# Patient Record
Sex: Female | Born: 1935 | Race: White | Hispanic: No | Smoking: Never smoker
Health system: Southern US, Community
[De-identification: ages and names within clinical notes are randomized; demographics above are authoritative.]

---

## 1997-12-26 ENCOUNTER — Ambulatory Visit (HOSPITAL_COMMUNITY): Admission: RE | Admit: 1997-12-26 | Discharge: 1997-12-26 | Payer: Self-pay | Admitting: Urology

## 1998-01-05 ENCOUNTER — Ambulatory Visit (HOSPITAL_COMMUNITY): Admission: RE | Admit: 1998-01-05 | Discharge: 1998-01-05 | Payer: Self-pay | Admitting: Urology

## 2009-12-11 ENCOUNTER — Ambulatory Visit (HOSPITAL_COMMUNITY): Admission: RE | Admit: 2009-12-11 | Discharge: 2009-12-11 | Payer: Self-pay | Admitting: Urology

## 2010-01-04 ENCOUNTER — Ambulatory Visit (HOSPITAL_COMMUNITY): Admission: RE | Admit: 2010-01-04 | Discharge: 2010-01-04 | Payer: Self-pay | Admitting: Urology

## 2011-08-17 IMAGING — CR DG ABDOMEN 1V
1 series · 1 of 1 positions shown · non-contrast
Comparison: KUB 12/11/2009.

CLINICAL DATA: Distal left ureteral stone.

ABDOMEN - 1 VIEW

[t abdomen supine]
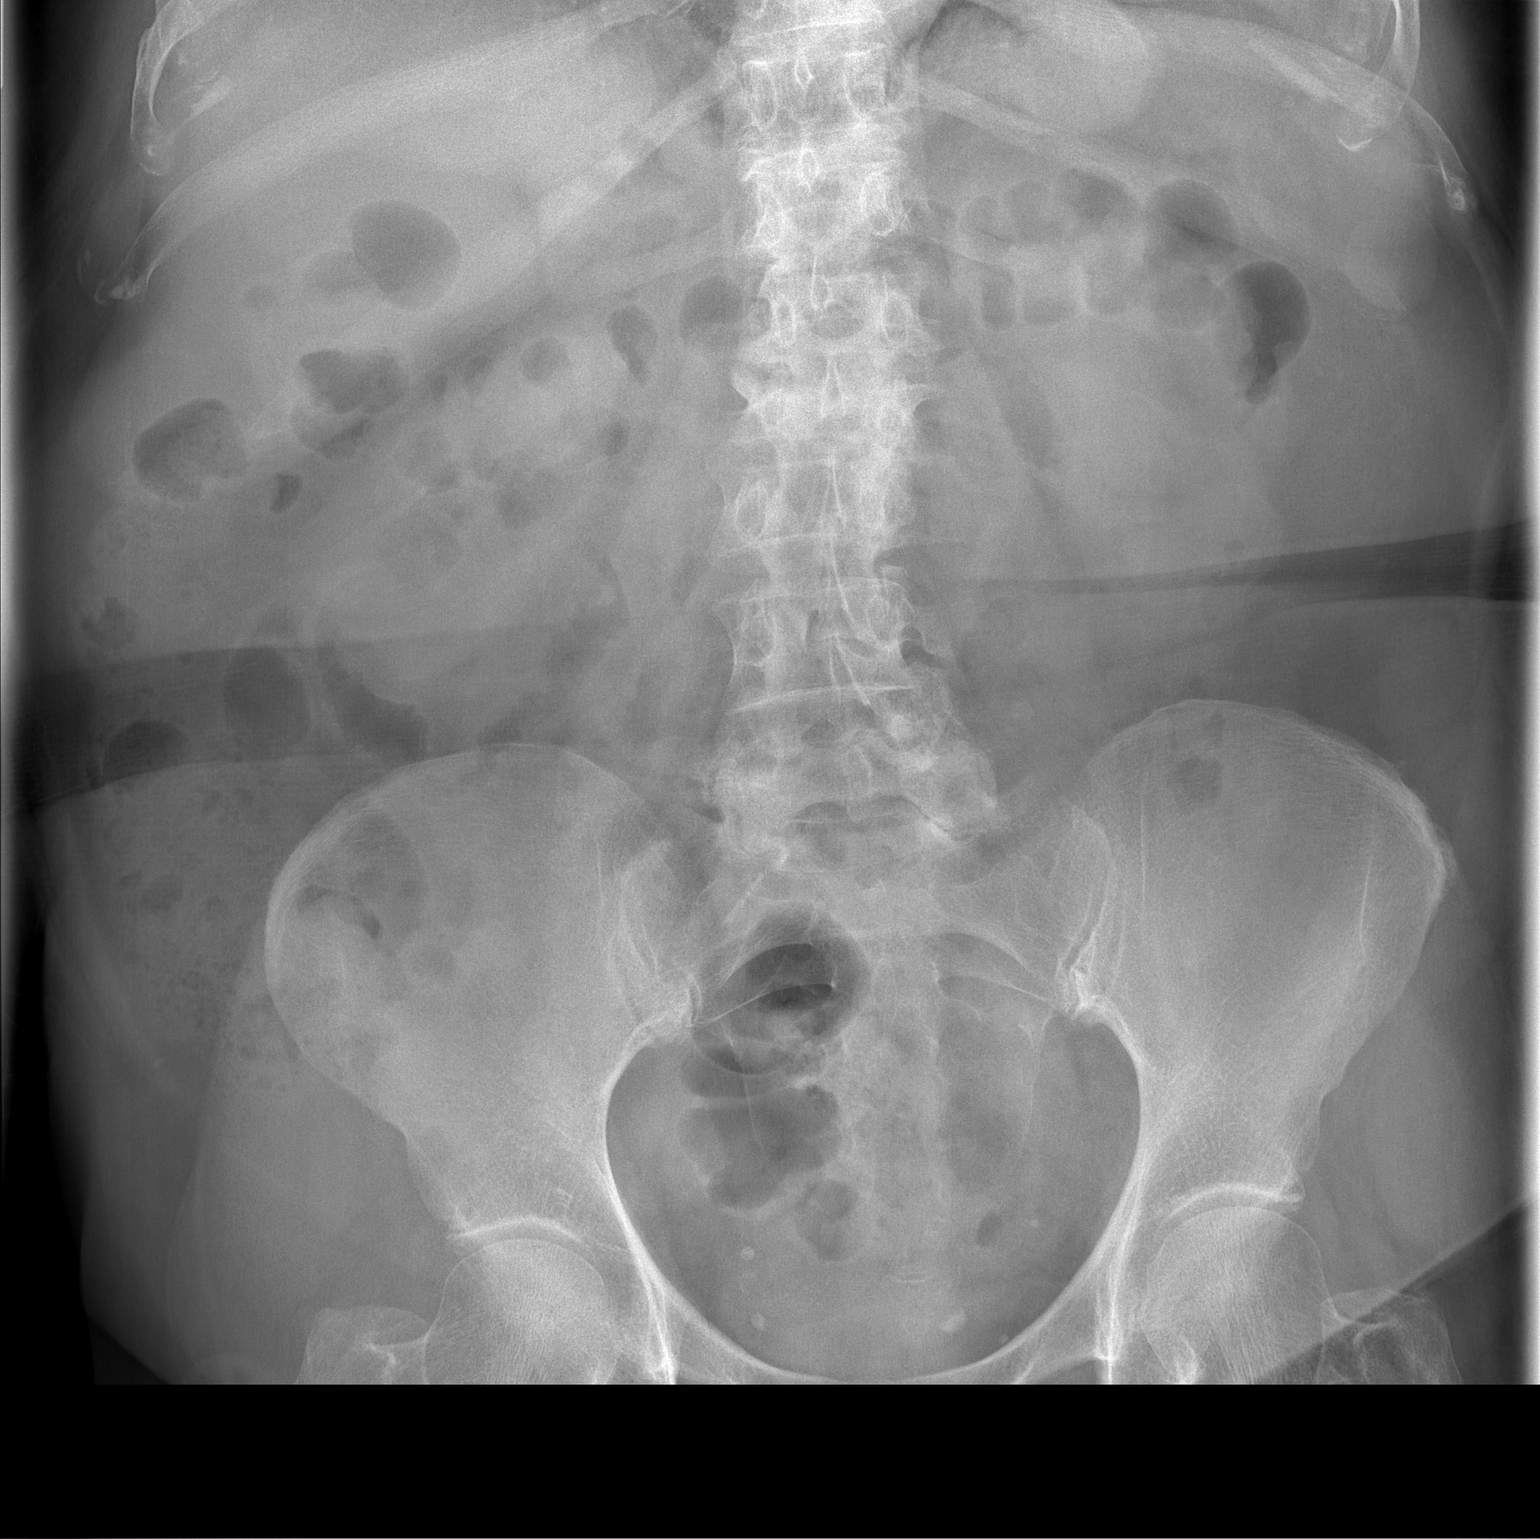

[1 of 1 positions shown; findings below may reference images not displayed]

FINDINGS: No definite renal or ureteral stones are seen on this
examination.  Calcifications in the pelvis are unchanged and likely
represent phleboliths although they cannot be definitively
characterized.  Bowel gas pattern is unremarkable.  Compression
fracture deformities of L1 and L2 are unchanged.
IMPRESSION: 1.  No definite renal or ureteral stones are seen by plain film.
2.  Compression fracture deformities L1 and L2.

## 2013-10-12 ENCOUNTER — Encounter: Payer: Self-pay | Admitting: Gastroenterology

## 2013-11-23 ENCOUNTER — Ambulatory Visit: Payer: Self-pay | Admitting: Gastroenterology

## 2022-04-22 ENCOUNTER — Encounter (INDEPENDENT_AMBULATORY_CARE_PROVIDER_SITE_OTHER): Payer: Self-pay

## 2022-04-25 ENCOUNTER — Ambulatory Visit (INDEPENDENT_AMBULATORY_CARE_PROVIDER_SITE_OTHER): Payer: Medicare Other | Admitting: Ophthalmology

## 2022-04-25 ENCOUNTER — Encounter (INDEPENDENT_AMBULATORY_CARE_PROVIDER_SITE_OTHER): Payer: Self-pay | Admitting: Ophthalmology

## 2022-04-25 DIAGNOSIS — H353132 Nonexudative age-related macular degeneration, bilateral, intermediate dry stage: Secondary | ICD-10-CM | POA: Diagnosis not present

## 2022-04-25 DIAGNOSIS — H35371 Puckering of macula, right eye: Secondary | ICD-10-CM | POA: Diagnosis not present

## 2022-04-25 DIAGNOSIS — Z961 Presence of intraocular lens: Secondary | ICD-10-CM | POA: Diagnosis not present

## 2022-04-25 MED ORDER — OFLOXACIN 0.3 % OP SOLN: 1.0000 [drp] | Freq: Four times a day (QID) | OPHTHALMIC | 0 refills | Status: AC

## 2022-04-25 MED ORDER — PREDNISOLONE ACETATE 1 % OP SUSP: 1.0000 [drp] | Freq: Four times a day (QID) | OPHTHALMIC | 0 refills | Status: AC

## 2022-04-25 NOTE — Progress Notes (Signed)
04/25/2022     CHIEF COMPLAINT Patient presents for  Chief Complaint  Patient presents with   Retina Evaluation      HISTORY OF PRESENT ILLNESS: Brooke Delgado is a 87 y.o. female who presents to the clinic today for:   HPI     Retina Evaluation           Laterality: right eye         Comments   NP- Epiretinal membrane od - ref by mccuen Pt states her vision has been stable Pt denies any new floaters or FOL      Last edited by Edmon Crape, MD on 04/25/2022 11:00 AM.      Referring physician: Maris Berger, MD [address] [place],  [place] [address]  HISTORICAL INFORMATION:   Selected notes from the MEDICAL RECORD NUMBER       CURRENT MEDICATIONS: Current Outpatient Medications (Ophthalmic Drugs)  Medication Sig   ofloxacin (OCUFLOX) 0.3 % ophthalmic solution Place 1 drop into the right eye 4 (four) times daily for 25 days.   prednisoLONE acetate (PRED FORTE) 1 % ophthalmic suspension Place 1 drop into the right eye 4 (four) times daily.   No current facility-administered medications for this visit. (Ophthalmic Drugs)   No current outpatient medications on file. (Other)   No current facility-administered medications for this visit. (Other)      REVIEW OF SYSTEMS: ROS   Negative for: Constitutional, Gastrointestinal, Neurological, Skin, Genitourinary, Musculoskeletal, HENT, Endocrine, Cardiovascular, Eyes, Respiratory, Psychiatric, Allergic/Imm, Heme/Lymph Last edited by Aleene Davidson, CMA on 04/25/2022  9:40 AM.       ALLERGIES Not on File  PAST MEDICAL HISTORY History reviewed. No pertinent past medical history. History reviewed. No pertinent surgical history.  FAMILY HISTORY History reviewed. No pertinent family history.  SOCIAL HISTORY Social History   Tobacco Use   Smoking status: Never   Smokeless tobacco: Never         OPHTHALMIC EXAM:  Base Eye Exam     Visual Acuity (Snellen - Linear)       Right Left    Dist [place] 20/80 20/25 +2   Dist ph [place] NI          Tonometry (Tonopen, 9:48 AM)       Right Left   Pressure 6 10         Pupils       Pupils   Right PERRL   Left PERRL         Visual Fields       Left Right    Full Full         Extraocular Movement       Right Left    Ortho Ortho    -- -- --  --  --  -- -- --   -- -- --  --  --  -- -- --           Dilation     Right eye: 2.5% Phenylephrine, 1.0% Mydriacyl @ 9:41 AM           Slit Lamp and Fundus Exam     External Exam       Right Left   External Normal Normal         Slit Lamp Exam       Right Left   Lids/Lashes Normal Normal   Conjunctiva/Sclera White and quiet White and quiet   Cornea Clear Clear   Anterior Chamber Deep  and quiet Deep and quiet   Iris Round and reactive Round and reactive   Lens Centered posterior chamber intraocular lens Centered posterior chamber intraocular lens   Anterior Vitreous Normal Normal         Fundus Exam       Right Left   Posterior Vitreous Normal Normal   Disc Normal Normal   C/D Ratio 0.4 0.4   Macula Epiretinal membrane, moderate topo distortion, Soft drusen, Retinal pigment epithelial detachment, 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 Scan locations included subfoveal. Central Foveal Thickness: 525. Progression has no prior data. Findings include abnormal foveal contour, retinal drusen , cystoid macular edema, epiretinal membrane.   Left Eye Quality was good. Scan locations included subfoveal. Central Foveal Thickness: 274. Progression has no prior data. Findings include normal foveal contour, retinal drusen .   Notes OD with significant thickening macular topographic distortion as well as  foveal macular schisis secondary to epiretinal membrane.  OU no sign of CNVM     Color Fundus Photography Optos - OU - Both Eyes       Right Eye Progression has no prior data. Disc findings include normal observations. Macula : drusen, epiretinal membrane.   Left Eye Progression has no prior data. Disc findings include normal observations. Macula : drusen. Vessels : normal observations. Periphery : normal observations.              ASSESSMENT/PLAN:  Epiretinal membrane, right eye OD with significant epiretinal membrane macular topographic distortion foveal macular schisis accounts for acuity in the right eye.  There is no sign of wet AMD.  We will need and recommend vitrectomy membrane peel the right eye.  Clinical course of recovery discussed with the patient including the healing process taking days weeks and sometimes even several months  Intermediate stage nonexudative age-related macular degeneration of both eyes The nature of age--related macular degeneration was discussed with the patient as well as the distinction between dry and wet types. Checking an Amsler Grid daily with advice to return immediately should a distortion develop, was given to the patient. The patient 's smoking status now and in the past was determined and advice based on the AREDS study was provided regarding the consumption of antioxidant supplements. AREDS 2 vitamin formulation was recommended. Consumption of dark leafy vegetables and fresh fruits of various colors was recommended. Treatment modalities for wet macular degeneration particularly the use of intravitreal injections of anti-blood vessel growth factors was discussed with the patient. Avastin, Lucentis, and Eylea are the available options. On occasion, therapy includes the use of photodynamic therapy and thermal laser. Stressed to the patient do not rub eyes.  Patient was advised to check Amsler Grid daily and return immediately if changes are  noted. Instructions on using the grid were given to the patient. All patient questions were answered.  Pseudophakia of both eyes Looks great OU per Dr. Ellie Lunch     ICD-10-CM   1. Epiretinal membrane, right eye  H35.371 OCT, Retina - OU - Both Eyes    Color Fundus Photography Optos - OU - Both Eyes    2. Intermediate stage nonexudative age-related macular degeneration of both eyes  H35.3132  Color Fundus Photography Optos - OU - Both Eyes    3. Pseudophakia of both eyes  Z96.1       1.  OD, with severe foveal macular schisis and macular thickening secondary to ERM.  I have recommended consideration for vitrectomy membrane peel.  I explained the patient that our next opportunity for surgery is the last Wednesday in September.  That as of October 5 that I will no longer be a affiliated with [facility] and the attendant contracts for [facility] would no longer be in place at that time.  2.  Reassured the patient that with the contrast in the October 5 that her continued postoperative care will continued unabated.  Without charge during the diagnosis of the same type  3.  Benefits reviewed with the patient.  She understands the need for surgical intervention.  She also understands she can wait 2 or 3 months for the new contracting to occur  Ophthalmic Meds Ordered this visit:  Meds ordered this encounter  Medications   ofloxacin (OCUFLOX) 0.3 % ophthalmic solution    Sig: Place 1 drop into the right eye 4 (four) times daily for 25 days.    Dispense:  5 mL    Refill:  0   prednisoLONE acetate (PRED FORTE) 1 % ophthalmic suspension    Sig: Place 1 drop into the right eye 4 (four) times daily.    Dispense:  10 mL    Refill:  0       Return , [facility], for Schedule vitrectomy, membrane [id number], OD.  There are no Patient Instructions on file for this visit.   Explained the diagnoses, plan, and follow up with the patient and they expressed  understanding.  Patient expressed understanding of the importance of proper follow up care.   Alford Highland Artrice Kraker M.D. Diseases & Surgery of the Retina and Vitreous [facility] 04/29/22     Abbreviations: M myopia (nearsighted); A astigmatism; H hyperopia (farsighted); P presbyopia; Mrx spectacle prescription;  CTL contact lenses; OD right eye; OS left eye; OU both eyes  XT exotropia; ET esotropia; PEK punctate epithelial keratitis; PEE punctate epithelial erosions; DES dry eye syndrome; MGD meibomian gland dysfunction; ATs artificial tears; PFAT's preservative free artificial tears; NSC nuclear sclerotic cataract; PSC posterior subcapsular cataract; ERM epi-retinal membrane; PVD posterior vitreous detachment; RD retinal detachment; DM diabetes mellitus; DR diabetic retinopathy; NPDR non-proliferative diabetic retinopathy; PDR proliferative diabetic retinopathy; CSME clinically significant macular edema; DME diabetic macular edema; dbh dot blot hemorrhages; CWS cotton wool spot; POAG primary open angle glaucoma; C/D cup-to-disc ratio; HVF humphrey visual field; GVF goldmann visual field; OCT optical coherence tomography; IOP intraocular pressure; BRVO Branch retinal vein occlusion; CRVO central retinal vein occlusion; CRAO central retinal artery occlusion; BRAO branch retinal artery occlusion; RT retinal tear; SB scleral buckle; PPV pars plana vitrectomy; VH Vitreous hemorrhage; PRP panretinal laser photocoagulation; IVK intravitreal kenalog; VMT vitreomacular traction; MH Macular hole;  NVD neovascularization of the disc; NVE neovascularization elsewhere; AREDS age related eye disease study; ARMD age related macular degeneration; POAG primary open angle glaucoma; EBMD epithelial/anterior basement membrane dystrophy; ACIOL anterior chamber intraocular lens; IOL intraocular lens; PCIOL posterior chamber intraocular lens; Phaco/IOL phacoemulsification with intraocular lens placement; PRK  photorefractive keratectomy; LASIK laser assisted in situ keratomileusis; HTN hypertension; DM diabetes mellitus; COPD chronic obstructive pulmonary disease

## 2022-04-25 NOTE — Assessment & Plan Note (Signed)
Looks great OU per Dr. Ellie Lunch

## 2022-04-25 NOTE — Assessment & Plan Note (Signed)

## 2022-04-25 NOTE — Assessment & Plan Note (Signed)
OD with significant epiretinal membrane macular topographic distortion foveal macular schisis accounts for acuity in the right eye.  There is no sign of wet AMD.  We will need and recommend vitrectomy membrane peel the right eye.  Clinical course of recovery discussed with the patient including the healing process taking days weeks and sometimes even several months

## 2022-04-29 ENCOUNTER — Encounter (INDEPENDENT_AMBULATORY_CARE_PROVIDER_SITE_OTHER): Payer: Self-pay | Admitting: Ophthalmology

## 2022-05-01 ENCOUNTER — Encounter (AMBULATORY_SURGERY_CENTER): Payer: Medicare Other | Admitting: Ophthalmology

## 2022-05-01 DIAGNOSIS — H35371 Puckering of macula, right eye: Secondary | ICD-10-CM

## 2022-05-02 ENCOUNTER — Ambulatory Visit (INDEPENDENT_AMBULATORY_CARE_PROVIDER_SITE_OTHER): Payer: Medicare Other | Admitting: Ophthalmology

## 2022-05-02 DIAGNOSIS — H353132 Nonexudative age-related macular degeneration, bilateral, intermediate dry stage: Secondary | ICD-10-CM

## 2022-05-02 DIAGNOSIS — H35371 Puckering of macula, right eye: Secondary | ICD-10-CM

## 2022-05-02 NOTE — Progress Notes (Addendum)
05/02/2022     CHIEF COMPLAINT Patient presents for  Chief Complaint  Patient presents with   Post-op Follow-up      HISTORY OF PRESENT ILLNESS: Brooke Delgado is a 87 y.o. female who presents to the clinic today for:   HPI     Post-op Follow-up           Laterality: right eye   Discomfort: Negative for pain, itching, foreign body sensation, tearing, discharge and floaters   Vision: is stable         Comments   1 DAY POST OP OD SX 05/01/22 , vitrectomy memb peel OD Pt stated, "It felt like something was in my eye and it felt like bug moving underneath my eye. But I don't feel it anymore." Pt denies pain. Pt stated vision is still blurry.       Last edited by Hurman Horn, MD on 05/02/2022  9:21 AM.      Referring physician: Donnajean Lopes, MD [place],  [place] [address]  HISTORICAL INFORMATION:   Selected notes from the MEDICAL RECORD NUMBER       CURRENT MEDICATIONS: Current Outpatient Medications (Ophthalmic Drugs)  Medication Sig   ofloxacin (OCUFLOX) 0.3 % ophthalmic solution Place 1 drop into the right eye 4 (four) times daily for 25 days.   prednisoLONE acetate (PRED FORTE) 1 % ophthalmic suspension Place 1 drop into the right eye 4 (four) times daily.   No current facility-administered medications for this visit. (Ophthalmic Drugs)   No current outpatient medications on file. (Other)   No current facility-administered medications for this visit. (Other)      REVIEW OF SYSTEMS: ROS   Negative for: Constitutional, Gastrointestinal, Neurological, Skin, Genitourinary, Musculoskeletal, HENT, Endocrine, Cardiovascular, Eyes, Respiratory, Psychiatric, Allergic/Imm, Heme/Lymph Last edited by Silvestre Moment on 05/02/2022  8:51 AM.       ALLERGIES Not on File  PAST MEDICAL HISTORY No past medical history on file. No past surgical history on file.  FAMILY HISTORY No family history on file.  SOCIAL HISTORY Social History    Tobacco Use   Smoking status: Never   Smokeless tobacco: Never         OPHTHALMIC EXAM:  Base Eye Exam     Visual Acuity (ETDRS)       Right Left   Dist cc 20/100 -2 20/20 -1   Dist ph cc NI     Correction: Glasses         Tonometry (Tonopen, 8:59 AM)       Right Left   Pressure 15 15         Pupils       Pupils APD   Right PERRL None   Left PERRL None         Visual Fields       Left Right    Full Full         Extraocular Movement       Right Left    Full, Ortho Full, Ortho         Neuro/Psych     Oriented x3: Yes         Dilation     Right eye: 1.0% Mydriacyl, 2.5% Phenylephrine @ 8:59 AM           Slit Lamp and Fundus Exam     External Exam       Right Left   External Normal Normal  Slit Lamp Exam       Right Left   Lids/Lashes Normal Normal   Conjunctiva/Sclera Trace Chemosis, Trace Injection, 1+ Subconjunctival hemorrhage White and quiet   Cornea Clear Clear   Anterior Chamber Deep and quiet, no cells Deep and quiet   Iris Round and reactive Round and reactive   Lens Centered posterior chamber intraocular lens Centered posterior chamber intraocular lens   Anterior Vitreous Normal, no cells Normal         Fundus Exam       Right Left   Posterior Vitreous Clear, avitric    Disc Normal    C/D Ratio 0.4    Macula Post ILM removal changes much less topographic distortion    Vessels Normal    Periphery Normal             IMAGING AND PROCEDURES  Imaging and Procedures for 05/02/22           ASSESSMENT/PLAN:  Intermediate stage nonexudative age-related macular degeneration of both eyes Stable today     ICD-10-CM   1. Epiretinal membrane, right eye  H35.371     2. Intermediate stage nonexudative age-related macular degeneration of both eyes  H35.3132       1.  Patient instructed to return to using topical eyedrops to the right eye today 1 drop of each bottle 4 times daily 2.   Physical restrictions reviewed with the patient and instructions provided  3.  Ophthalmic Meds Ordered this visit:  No orders of the defined types were placed in this encounter.      Return in about 5 days (around 05/07/2022) for dilate, OD, OCT, POST OP.  Patient Instructions  Ofloxacin  4 times daily to the operative eye  Prednisolone acetate 1 drop to the operative eye 4 times daily  Patient instructed not to refill the medications and use them for maximum of 3 weeks.  Patient instructed do not rub the eye.  Patient has the option to use the patch at night.   No lifting and bending for 1 week. No water IN the eye for 10 days. Do not rub the eye. Wear shield at night for 1-3 days.  Continue your topical medications for a total of 3 weeks.  Do not refill your postoperative medications unless instructed.  Refrain from exercise or intentional activity which increases our heart rate above resting levels.  Normal walking to complete normal activities of your day are appropriate.  Driving:  Legally, you only need one good eye, of 20/40 or better to drive.  However, the practice does not recommend driving during first weeks after surgery, IF you are uncomfortable with your visual functioning or capabilities.   If you have known sleep apnea, wear your CPAP as you normally should.   Explained the diagnoses, plan, and follow up with the patient and they expressed understanding.  Patient expressed understanding of the importance of proper follow up care.   Alford Highland Salvatrice Morandi M.D. Diseases & Surgery of the Retina and Vitreous [facility] 05/02/22     Abbreviations: M myopia (nearsighted); A astigmatism; H hyperopia (farsighted); P presbyopia; Mrx spectacle prescription;  CTL contact lenses; OD right eye; OS left eye; OU both eyes  XT exotropia; ET esotropia; PEK punctate epithelial keratitis; PEE punctate epithelial erosions; DES dry eye syndrome; MGD meibomian gland  dysfunction; ATs artificial tears; PFAT's preservative free artificial tears; NSC nuclear sclerotic cataract; PSC posterior subcapsular cataract; ERM epi-retinal membrane; PVD posterior vitreous detachment;  RD retinal detachment; DM diabetes mellitus; DR diabetic retinopathy; NPDR non-proliferative diabetic retinopathy; PDR proliferative diabetic retinopathy; CSME clinically significant macular edema; DME diabetic macular edema; dbh dot blot hemorrhages; CWS cotton wool spot; POAG primary open angle glaucoma; C/D cup-to-disc ratio; HVF humphrey visual field; GVF goldmann visual field; OCT optical coherence tomography; IOP intraocular pressure; BRVO Branch retinal vein occlusion; CRVO central retinal vein occlusion; CRAO central retinal artery occlusion; BRAO branch retinal artery occlusion; RT retinal tear; SB scleral buckle; PPV pars plana vitrectomy; VH Vitreous hemorrhage; PRP panretinal laser photocoagulation; IVK intravitreal kenalog; VMT vitreomacular traction; MH Macular hole;  NVD neovascularization of the disc; NVE neovascularization elsewhere; AREDS age related eye disease study; ARMD age related macular degeneration; POAG primary open angle glaucoma; EBMD epithelial/anterior basement membrane dystrophy; ACIOL anterior chamber intraocular lens; IOL intraocular lens; PCIOL posterior chamber intraocular lens; Phaco/IOL phacoemulsification with intraocular lens placement; Lupus photorefractive keratectomy; LASIK laser assisted in situ keratomileusis; HTN hypertension; DM diabetes mellitus; COPD chronic obstructive pulmonary disease

## 2022-05-02 NOTE — Patient Instructions (Addendum)

## 2022-05-02 NOTE — Assessment & Plan Note (Signed)
Stable today.

## 2022-05-08 ENCOUNTER — Ambulatory Visit (INDEPENDENT_AMBULATORY_CARE_PROVIDER_SITE_OTHER): Payer: Medicare Other | Admitting: Ophthalmology

## 2022-05-08 ENCOUNTER — Encounter (INDEPENDENT_AMBULATORY_CARE_PROVIDER_SITE_OTHER): Payer: Self-pay | Admitting: Ophthalmology

## 2022-05-08 DIAGNOSIS — H35371 Puckering of macula, right eye: Secondary | ICD-10-CM | POA: Diagnosis not present

## 2022-05-08 DIAGNOSIS — H353132 Nonexudative age-related macular degeneration, bilateral, intermediate dry stage: Secondary | ICD-10-CM

## 2022-05-08 NOTE — Patient Instructions (Signed)
Ofloxacin  4 times daily to the operative eye  Prednisolone acetate 1 drop to the operative eye 4 times daily  Patient instructed not to refill the medications and use them for maximum of 3 weeks.  Patient instructed do not rub the eye.  Patient has the option to use the patch at night.   Patient to complete the medications over the next 10 to 14 days.  Do not refill the medications.  If they are completed she does not need to use any more medications.  Full activity can be resumed in 3 days.  Do not mash compress or rub the eye.

## 2022-05-08 NOTE — Assessment & Plan Note (Signed)
1 week postop with improving acuity already.  Complete the topical medications over the next 2 weeks.  Do not refill the medications.

## 2022-05-08 NOTE — Progress Notes (Signed)
05/08/2022     CHIEF COMPLAINT Patient presents for  Chief Complaint  Patient presents with   Post-op Follow-up      HISTORY OF PRESENT ILLNESS: Brooke Delgado is a 87 y.o. female who presents to the clinic today for:   HPI   Epiretinal membrane, right eye   6 days post op dilate od oct  Pt states her vision has been stable and improved Pt denies any new floaters or FOL  Last edited by Edmon Crape, MD on 05/08/2022  9:44 AM.      Referring physician: Garlan Fillers, MD [address] [place],  [place] [address]  HISTORICAL INFORMATION:   Selected notes from the MEDICAL RECORD NUMBER       CURRENT MEDICATIONS: Current Outpatient Medications (Ophthalmic Drugs)  Medication Sig   ofloxacin (OCUFLOX) 0.3 % ophthalmic solution Place 1 drop into the right eye 4 (four) times daily for 25 days.   prednisoLONE acetate (PRED FORTE) 1 % ophthalmic suspension Place 1 drop into the right eye 4 (four) times daily.   No current facility-administered medications for this visit. (Ophthalmic Drugs)   No current outpatient medications on file. (Other)   No current facility-administered medications for this visit. (Other)      REVIEW OF SYSTEMS: ROS   Negative for: Constitutional, Gastrointestinal, Neurological, Skin, Genitourinary, Musculoskeletal, HENT, Endocrine, Cardiovascular, Eyes, Respiratory, Psychiatric, Allergic/Imm, Heme/Lymph Last edited by Edmon Crape, MD on 05/08/2022  9:44 AM.       ALLERGIES Not on File  PAST MEDICAL HISTORY History reviewed. No pertinent past medical history. History reviewed. No pertinent surgical history.  FAMILY HISTORY History reviewed. No pertinent family history.  SOCIAL HISTORY Social History   Tobacco Use   Smoking status: Never   Smokeless tobacco: Never         OPHTHALMIC EXAM:  Base Eye Exam     Visual Acuity (ETDRS)       Right Left   Dist cc 20/50 -1 20/20 -1         Tonometry (Tonopen, 8:55  AM)       Right Left   Pressure 6 8         Pupils       Pupils   Right PERRL   Left PERRL         Visual Fields       Left Right    Full Full         Extraocular Movement       Right Left    Ortho Ortho    -- -- --  --  --  -- -- --   -- -- --  --  --  -- -- --           Neuro/Psych     Oriented x3: Yes         Dilation     Right eye: 2.5% Phenylephrine, 1.0% Mydriacyl @ 8:50 AM           Slit Lamp and Fundus Exam     External Exam       Right Left   External Normal Normal         Slit Lamp Exam       Right Left   Lids/Lashes Normal Normal   Conjunctiva/Sclera Trace Chemosis, Trace Injection, 1+ Subconjunctival hemorrhage White and quiet   Cornea Clear Clear   Anterior Chamber Deep and quiet, no cells Deep and quiet   Iris Round  and reactive Round and reactive   Lens Centered posterior chamber intraocular lens Centered posterior chamber intraocular lens   Anterior Vitreous Normal, no cells Normal         Fundus Exam       Right Left   Posterior Vitreous Clear, avitric    Disc Normal    C/D Ratio 0.4    Macula much less topographic distortion    Vessels Normal    Periphery Normal             IMAGING AND PROCEDURES  Imaging and Procedures for 05/08/22  OCT, Retina - OU - Both Eyes       Right Eye Quality was good. Scan locations included subfoveal. Central Foveal Thickness: 417. Progression has improved. Findings include abnormal foveal contour, retinal drusen , cystoid macular edema, epiretinal membrane.   Left Eye Quality was good. Scan locations included subfoveal. Central Foveal Thickness: 274. Progression has been stable. Findings include normal foveal contour, retinal drusen .   Notes OD/thickening and no CME remained only 1 week post vitrectomy membrane peel for severe ERM.  Improved acuity.  No sign of CNVM.  OU no sign of CNVM              ASSESSMENT/PLAN:  Epiretinal membrane, right  eye 1 week postop with improving acuity already.  Complete the topical medications over the next 2 weeks.  Do not refill the medications.  Intermediate stage nonexudative age-related macular degeneration of both eyes Stable no sign of CNVM.     ICD-10-CM   1. Epiretinal membrane, right eye  H35.371 OCT, Retina - OU - Both Eyes    2. Intermediate stage nonexudative age-related macular degeneration of both eyes  H35.3132       1.  OD looks great.  5 days post vitrectomy membrane peel preoperative vision of 2080 and now improved to 20/50.  Likely to continue to improve as all intraretinal fluid, CME has abated.  Vision may be limited by some subfoveal drusen.  No other complications.  2.  Looks great.  Complete medications over the next 2 weeks.  3.  Ophthalmic Meds Ordered this visit:  No orders of the defined types were placed in this encounter.      Return in about 3 months (around 08/08/2022) for DILATE OU, OCT.  Patient Instructions  Ofloxacin  4 times daily to the operative eye  Prednisolone acetate 1 drop to the operative eye 4 times daily  Patient instructed not to refill the medications and use them for maximum of 3 weeks.  Patient instructed do not rub the eye.  Patient has the option to use the patch at night.   Patient to complete the medications over the next 10 to 14 days.  Do not refill the medications.  If they are completed she does not need to use any more medications.  Full activity can be resumed in 3 days.  Do not mash compress or rub the eye.   Explained the diagnoses, plan, and follow up with the patient and they expressed understanding.  Patient expressed understanding of the importance of proper follow up care.   Clent Demark Aleisa Howk M.D. Diseases & Surgery of the Retina and Vitreous [facility] [place] 05/08/22     Abbreviations: M myopia (nearsighted); A astigmatism; H hyperopia (farsighted); P presbyopia; Mrx spectacle prescription;  CTL  contact lenses; OD right eye; OS left eye; OU both eyes  XT exotropia; ET esotropia; PEK punctate epithelial keratitis; PEE  punctate epithelial erosions; DES dry eye syndrome; MGD meibomian gland dysfunction; ATs artificial tears; PFAT's preservative free artificial tears; [place] nuclear sclerotic cataract; PSC posterior subcapsular cataract; ERM epi-retinal membrane; PVD posterior vitreous detachment; RD retinal detachment; DM diabetes mellitus; DR diabetic retinopathy; NPDR non-proliferative diabetic retinopathy; PDR proliferative diabetic retinopathy; CSME clinically significant macular edema; DME diabetic macular edema; dbh dot blot hemorrhages; CWS cotton wool spot; POAG primary open angle glaucoma; C/D cup-to-disc ratio; HVF humphrey visual field; GVF goldmann visual field; OCT optical coherence tomography; IOP intraocular pressure; BRVO Branch retinal vein occlusion; CRVO central retinal vein occlusion; CRAO central retinal artery occlusion; BRAO branch retinal artery occlusion; RT retinal tear; SB scleral buckle; PPV pars plana vitrectomy; VH Vitreous hemorrhage; PRP panretinal laser photocoagulation; IVK intravitreal kenalog; VMT vitreomacular traction; MH Macular hole;  NVD neovascularization of the disc; NVE neovascularization elsewhere; AREDS age related eye disease study; ARMD age related macular degeneration; POAG primary open angle glaucoma; EBMD epithelial/anterior basement membrane dystrophy; ACIOL anterior chamber intraocular lens; IOL intraocular lens; PCIOL posterior chamber intraocular lens; Phaco/IOL phacoemulsification with intraocular lens placement; [place] photorefractive keratectomy; LASIK laser assisted in situ keratomileusis; HTN hypertension; DM diabetes mellitus; COPD chronic obstructive pulmonary disease

## 2022-05-08 NOTE — Assessment & Plan Note (Signed)
Stable no sign of CNVM ?

## 2022-05-11 ENCOUNTER — Other Ambulatory Visit (INDEPENDENT_AMBULATORY_CARE_PROVIDER_SITE_OTHER): Payer: Self-pay | Admitting: Ophthalmology

## 2022-06-06 ENCOUNTER — Encounter (INDEPENDENT_AMBULATORY_CARE_PROVIDER_SITE_OTHER): Payer: Medicare Other | Admitting: Ophthalmology

## 2022-08-08 ENCOUNTER — Encounter (INDEPENDENT_AMBULATORY_CARE_PROVIDER_SITE_OTHER): Payer: Medicare Other | Admitting: Ophthalmology
